# Patient Record
Sex: Female | Born: 1995 | Race: White | Hispanic: No | Marital: Single | Smoking: Current every day smoker
Health system: Southern US, Community
[De-identification: ages and names within clinical notes are randomized; demographics above are authoritative.]

## PROBLEM LIST (undated history)

## (undated) HISTORY — PX: ROTATOR CUFF REPAIR: SHX139

---

## 2016-10-02 ENCOUNTER — Encounter (HOSPITAL_COMMUNITY): Payer: Self-pay | Admitting: *Deleted

## 2016-10-02 ENCOUNTER — Emergency Department (HOSPITAL_COMMUNITY): Payer: BC Managed Care – PPO

## 2016-10-02 ENCOUNTER — Emergency Department (HOSPITAL_COMMUNITY)
Admission: EM | Admit: 2016-10-02 | Discharge: 2016-10-02 | Disposition: A | Discharge status: Home / Self Care | Payer: BC Managed Care – PPO | Attending: Emergency Medicine | Admitting: Emergency Medicine

## 2016-10-02 DIAGNOSIS — F17200 Nicotine dependence, unspecified, uncomplicated: Secondary | ICD-10-CM | POA: Insufficient documentation

## 2016-10-02 DIAGNOSIS — M25512 Pain in left shoulder: Secondary | ICD-10-CM | POA: Insufficient documentation

## 2016-10-02 DIAGNOSIS — Y939 Activity, unspecified: Secondary | ICD-10-CM | POA: Insufficient documentation

## 2016-10-02 DIAGNOSIS — Y998 Other external cause status: Secondary | ICD-10-CM | POA: Diagnosis not present

## 2016-10-02 DIAGNOSIS — W19XXXA Unspecified fall, initial encounter: Secondary | ICD-10-CM | POA: Insufficient documentation

## 2016-10-02 DIAGNOSIS — Y929 Unspecified place or not applicable: Secondary | ICD-10-CM | POA: Diagnosis not present

## 2016-10-02 LAB — POC URINE PREG, ED: PREG TEST UR: NEGATIVE

## 2016-10-02 MED ORDER — ACETAMINOPHEN 500 MG PO TABS
1000.0000 mg | ORAL_TABLET | Freq: Once | ORAL | Status: AC
  Administered 2016-10-02: 1000 mg via ORAL
  Filled 2016-10-02: qty 2

## 2016-10-02 MED ORDER — KETOROLAC TROMETHAMINE 60 MG/2ML IM SOLN
60.0000 mg | Freq: Once | INTRAMUSCULAR | Status: AC
  Administered 2016-10-02: 60 mg via INTRAMUSCULAR
  Filled 2016-10-02: qty 2

## 2016-10-02 NOTE — ED Provider Notes (Signed)
[facility] Provider Note   CSN: [id number] Arrival date & time: 10/02/16  1620     History   Chief Complaint Chief Complaint  Patient presents with  . Shoulder Injury    HPI Sheryl Holland is a 21 y.o. female.  Left shoulder pain after a hyperextension injury secondary to a fall just prio to ED visit. She is a patient at [facility] for alcohol and other drugs. She is status post left shoulder [place] a [place]ator cuff [place] and other issues.  Severity of pain is moderate. Movement and palpation make pain worse.      History reviewed. No pertinent past medical history.  There are no active problems to display for this patient.   History reviewed. No pertinent surgical history.  OB History    No data available       Home Medications    Prior to Admission medications   Medication Sig Start Date End Date Taking? Authorizing Provider  ARIPiprazole (ABILIFY) 5 MG tablet Take 5 mg by mouth daily.   Yes [provider]  Calcium & Magnesium Carbonates (MYLANTA PO) Take 10-20 mLs by mouth daily as needed (indigestion).   Yes [provider]  FLUoxetine (PROZAC) 20 MG tablet Take 60 mg by mouth daily.   Yes [provider]  ketorolac (TORADOL) 10 MG tablet Take 10 mg by mouth See admin instructions. Ordered 10/01/16 - take 1 tablet (10 mg) by mouth three times daily for 3 days   Yes [provider]  Melatonin 5 MG TABS Take 5 mg by mouth at bedtime.   Yes [provider]  PRESCRIPTION MEDICATION Take 15-30 mLs by mouth See admin instructions. Nausea control liquid - take 1-2 tablespoons (15-30 mls) every 15 minutes as needed for nausea and vomiting until relief   Yes [provider]  traZODone (DESYREL) 50 MG tablet Take 50 mg by mouth at bedtime.   Yes [provider]    Family History No family history on file.  Social History Social History  Substance  Use Topics  . Smoking status: Current Every Day Smoker  . Smokeless tobacco: Never Used  . Alcohol use No     Allergies   Patient has no known allergies.   Review of Systems Review of Systems  All other systems reviewed and are negative.    Physical Exam Updated Vital Signs BP (!) 143/89 (BP Location: Right Arm)   Pulse 96   Temp 98.3 F (36.8 C) (Oral)   Resp 16   Ht 5\' 9"  (1.753 m)   Wt 77.1 kg (170 lb)   LMP 09/02/2016   SpO2 100%   BMI 25.10 kg/m   Physical Exam  Constitutional: She is oriented to person, place, and time. She appears well-developed and well-nourished.  HENT:  Head: Normocephalic and atraumatic.  Eyes: Conjunctivae are normal.  Neck: Neck supple.  Cardiovascular: Normal rate and regular rhythm.   Pulmonary/Chest: Effort normal and breath sounds normal.  Abdominal: Soft. Bowel sounds are normal.  Musculoskeletal:  Left shoulder: Moderate generalized tenderness about the shoulder;  pain with range of motion  Neurological: She is alert and oriented to person, place, and time.  Skin: Skin is warm and dry.  Psychiatric: She has a normal mood and affect. Her behavior is normal.  Nursing note and vitals reviewed.    ED Treatments / Results  Labs (all labs ordered are listed, but only abnormal results are displayed)  Labs Reviewed  POC URINE PREG, ED    EKG  EKG Interpretation None       Radiology Ct Shoulder Left Wo Contrast  Result Date: 10/02/2016 CLINICAL DATA:  Patient fell backwards onto her bed 2 hours ago. History of shoulder surgery 2 months ago. Question of anterior subluxation on same day radiographs of the left shoulder. EXAM: CT OF THE UPPER LEFT EXTREMITY WITHOUT CONTRAST TECHNIQUE: Multidetector CT imaging of the upper left extremity was performed according to the standard protocol. COMPARISON:  10/02/2016 shoulder radiographs FINDINGS: Bones/Joint/Cartilage No dislocation or subluxation of the humeral head at the  glenohumeral joint. Small glenohumeral joint effusion without intra-articular loose bodies. Postop appearance of the glenoid presumably for a bony Bankart lesion with two fixation screws noted through the glenoid. Surgical anchorsnoted within the humeral head. Large Hill-Sachs deformity noted of the humeral head posterolaterally. Ligaments Suboptimally assessed by CT. Muscles and Tendons Negative Soft tissues Negative.  The adjacent ribs and included lungs are nonacute. IMPRESSION: 1. Postop appearance of the left shoulder with joint effusion. No joint subluxation or dislocation is identified. 2. 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 Electronically Signed   By: Tollie Eth M.D.   On: 10/02/2016 21:03   Dg Shoulder Left  Result Date: 10/02/2016 CLINICAL DATA:  Generalized left shoulder pain after a fall today. EXAM: LEFT SHOULDER - 2+ VIEW COMPARISON:  None. FINDINGS: Cannulated screws are identified in the glenoid with surgical device overlying the proximal humerus. No evidence of an acute fracture. The acromioclavicular and coracoclavicular distances appear preserved. Joint space in the glenoid is narrowed and the scapular Y-view suggests anterior subluxation of the humeral head. IMPRESSION: Limited study due to patient discomfort and inability to appropriately position. Postsurgical changes are noted in the shoulder with loss of joint space in the inferior glenohumeral joint and question of anterior subluxation of the humeral head. Electronically Signed   By: Kennith Center M.D.   On: 10/02/2016 18:06    Procedures Procedures (including critical care time)  Medications Ordered in ED Medications  ketorolac (TORADOL) injection 60 mg (60 mg Intramuscular Given 10/02/16 1806)  acetaminophen (TYLENOL) tablet 1,000 mg (1,000 mg Oral Given 10/02/16 2038)     Initial Impression / Assessment and Plan / ED Course  I have reviewed the triage vital  signs and the nursing notes.  Pertinent labs & imaging results that were available during my care of the patient were reviewed by me and considered in my medical decision making (see chart for details).     Patient is hemodynamically stable. CT scan report as above. There appears to be no acute injury. Will recommend ice, sling, Tylenol, ibuprofen, referral to local orthopedics.  Final Clinical Impressions(s) / ED Diagnoses   Final diagnoses:  Acute pain of left shoulder    New Prescriptions New Prescriptions   No medications on file     Donnetta Hutching, MD 10/02/16 2130

## 2016-10-02 NOTE — ED Triage Notes (Signed)
The pt reports that she fell backward  Onto her bed  approx  2 hours ago .  [place]  [facility]nel [facility] from [facility]

## 2016-10-02 NOTE — ED Notes (Signed)
Zenett from [facility] called for update.  Advised we are awaiting CT results.  Asked that she ne called upon patients ready for discharge at [phone].  No narcotics.

## 2016-10-02 NOTE — ED Notes (Signed)
Received call from someone named Mrs Sheryl Holland from [facility] reporting that pt is part of program at [facility] and is not allowed to have any narcotics for pain. Per Mrs Sheryl Holland, pt has old shoulder injury that is healed.

## 2016-10-02 NOTE — ED Notes (Signed)
[facility] states patient recently faked injury to seek narcotics from ED. Story about injury has changed a few times. Pt. 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

## 2016-10-02 NOTE — Discharge Instructions (Signed)
Your CT scan of the shoulder shows no serious damage. Recommend ice, sling, Tylenol, ibuprofen. Can take Tylenol and ibuprofen simultaneously.

## 2016-10-11 ENCOUNTER — Emergency Department (HOSPITAL_COMMUNITY)
Admission: EM | Admit: 2016-10-11 | Discharge: 2016-10-11 | Disposition: A | Discharge status: Home / Self Care | Payer: BC Managed Care – PPO | Attending: Emergency Medicine | Admitting: Emergency Medicine

## 2016-10-11 ENCOUNTER — Encounter (HOSPITAL_COMMUNITY): Payer: Self-pay | Admitting: *Deleted

## 2016-10-11 ENCOUNTER — Emergency Department (HOSPITAL_COMMUNITY): Payer: BC Managed Care – PPO

## 2016-10-11 DIAGNOSIS — F17200 Nicotine dependence, unspecified, uncomplicated: Secondary | ICD-10-CM | POA: Diagnosis not present

## 2016-10-11 DIAGNOSIS — M25512 Pain in left shoulder: Secondary | ICD-10-CM | POA: Insufficient documentation

## 2016-10-11 DIAGNOSIS — Z79899 Other long term (current) drug therapy: Secondary | ICD-10-CM | POA: Insufficient documentation

## 2016-10-11 MED ORDER — KETOROLAC TROMETHAMINE 30 MG/ML IJ SOLN
30.0000 mg | Freq: Once | INTRAMUSCULAR | Status: AC
  Administered 2016-10-11: 30 mg via INTRAVENOUS
  Filled 2016-10-11: qty 1

## 2016-10-11 MED ORDER — NAPROXEN 500 MG PO TABS: 500.0000 mg | ORAL_TABLET | Freq: Two times a day (BID) | ORAL | 0 refills | Status: DC

## 2016-10-11 MED ORDER — METHOCARBAMOL 500 MG PO TABS: 500.0000 mg | ORAL_TABLET | Freq: Four times a day (QID) | ORAL | 0 refills | Status: DC

## 2016-10-11 MED ORDER — DEXTROSE 5 % IV SOLN
1000.0000 mg | Freq: Once | INTRAVENOUS | Status: AC
  Administered 2016-10-11: 1000 mg via INTRAVENOUS
  Filled 2016-10-11 (×2): qty 10

## 2016-10-11 MED ORDER — METHOCARBAMOL 1000 MG/10ML IJ SOLN: 1000.0000 mg | Freq: Once | INTRAMUSCULAR | Status: DC

## 2016-10-11 NOTE — Progress Notes (Signed)
Orthopedic Tech Progress Note Patient Details:  Sheryl Holland 1995/04/27 [id number]  Ortho Devices Type of Ortho Device: Sling immobilizer Ortho Device/Splint Location: lue Ortho Device/Splint Interventions: Application   Sheryl Holland 10/11/2016, 12:36 PM

## 2016-10-11 NOTE — ED Provider Notes (Signed)
[facility] Provider Note   CSN: [id number] Arrival date & time: 10/11/16  1014     History   Chief Complaint Chief Complaint  Patient presents with  . Shoulder Pain    dislocation    HPI Sheryl Holland is a 21 y.o. female.  Patient with history of substance abuse, currently at [facility], patient with multiple left shoulder dislocations with surgery performed 2 months ago -- presents with complaint of probable shoulder dislocation. Patient with acute onset of left shoulder pain. Patient states that she had her arm around somebody and felt it slip out of joint. She had immediate pain. No treatments prior to arrival. Symptoms are similar to previous dislocations. The onset of this condition was acute. The course is constant. Aggravating factors: movement. Alleviating factors: none.        History reviewed. No pertinent past medical history.  There are no active problems to display for this patient.   Past Surgical History:  Procedure Laterality Date  . ROTATOR CUFF REPAIR      OB History    No data available       Home Medications    Prior to Admission medications   Medication Sig Start Date End Date Taking? Authorizing Provider  ARIPiprazole (ABILIFY) 5 MG tablet Take 5 mg by mouth daily.    [provider]  Calcium & Magnesium Carbonates (MYLANTA PO) Take 10-20 mLs by mouth daily as needed (indigestion).    [provider]  FLUoxetine (PROZAC) 20 MG tablet Take 60 mg by mouth daily.    [provider]  ketorolac (TORADOL) 10 MG tablet Take 10 mg by mouth See admin instructions. Ordered 10/01/16 - take 1 tablet (10 mg) by mouth three times daily for 3 days    [provider]  Melatonin 5 MG TABS Take 5 mg by mouth at bedtime.    [provider]  PRESCRIPTION MEDICATION Take 15-30 mLs by mouth See admin instructions. Nausea control liquid - take 1-2 tablespoons (15-30 mls) every 15 minutes as needed for nausea  and vomiting until relief    [provider]  traZODone (DESYREL) 50 MG tablet Take 50 mg by mouth at bedtime.    [provider]    Family History No family history on file.  Social History Social History  Substance Use Topics  . Smoking status: Current Every Day Smoker  . Smokeless tobacco: Never Used  . Alcohol use No     Allergies   Patient has no known allergies.   Review of Systems Review of Systems  Constitutional: Negative for activity change.  Musculoskeletal: Positive for arthralgias. Negative for back pain, joint swelling and neck pain.  Skin: Negative for wound.  Neurological: Negative for weakness and numbness.     Physical Exam Updated Vital Signs BP 131/81   Pulse 95   Temp 98.8 F (37.1 C) (Oral)   Resp (!) 22   Ht 5\' 9"  (1.753 m)   Wt 72.6 kg (160 lb)   LMP 10/06/2016   SpO2 100%   BMI 23.63 kg/m   Physical Exam  Constitutional: She appears well-developed and well-nourished.  HENT:  Head: Normocephalic and atraumatic.  Eyes: Pupils are equal, round, and reactive to light.  Neck: Normal range of motion. Neck supple.  Cardiovascular: Normal pulses.  Exam reveals no decreased pulses.   Musculoskeletal: She exhibits tenderness. She exhibits no edema.       Right shoulder: Normal.       Left  shoulder: She exhibits decreased range of motion and tenderness. She exhibits no swelling.       Right elbow: Normal.      Left elbow: Normal.       Cervical back: Normal.  Neurological: She is alert. No sensory deficit.  Motor, sensation, and vascular distal to the injury is fully intact.   Skin: Skin is warm and dry.  Psychiatric: She has a normal mood and affect.  Nursing note and vitals reviewed.    ED Treatments / Results   Radiology Dg Shoulder Left  Result Date: 10/11/2016 CLINICAL DATA:  Fall. EXAM: LEFT SHOULDER - 2+ VIEW COMPARISON:  None. FINDINGS: Surgical screw noted about the left acetabulum in unchanged position.  Metallic density noted in the left humerus in unchanged position. No acute abnormality identified. No evidence of fracture or dislocation . IMPRESSION: Stable postsurgical changes left shoulder. No acute abnormality identified. Electronically Signed   By: Maisie Fushomas  Register   On: 10/11/2016 11:12    Procedures Procedures (including critical care time)  Medications Ordered in ED Medications  methocarbamol (ROBAXIN) injection 1,000 mg (not administered)     Initial Impression / Assessment and Plan / ED Course  I have reviewed the triage vital signs and the nursing notes.  Pertinent labs & imaging results that were available during my care of the patient were reviewed by me and considered in my medical decision making (see chart for details).     Vital signs reviewed and are as follows: Vitals:   10/11/16 1230 10/11/16 1245  BP: 96/69 107/63  Pulse: 75 78  Resp:    Temp:     X-ray and previous CT reviewed. No signs of dislocation today.  Patient treated in the emergency department with Toradol and IV Robaxin.  She states that she is able to have muscle relaxers at her treatment facility. Will give course of Robaxin. Patient to use sling as needed for symptoms control while shoulder is acutely tender.   Final Clinical Impressions(s) / ED Diagnoses   Final diagnoses:  Acute pain of left shoulder   Patient with acute left shoulder pain, history of previous surgery and frequent dislocations. Patient had emergency department visit with x-ray and CT one week ago which did not demonstrate dislocation. X-ray similar appearing today without dislocation. Will treat conservatively. Pt currently in treatment for substance abuse.   New Prescriptions New Prescriptions   METHOCARBAMOL (ROBAXIN) 500 MG TABLET    Take 1 tablet (500 mg total) by mouth 4 (four) times daily.   NAPROXEN (NAPROSYN) 500 MG TABLET    Take 1 tablet (500 mg total) by mouth 2 (two) times daily.     Renne CriglerGeiple, Demarqus Jocson,  PA-C 10/11/16 1253    Charlynne PanderYao, David Hsienta, MD 10/13/16 [phone]

## 2016-10-11 NOTE — ED Triage Notes (Signed)
[facility] here from [facility] for possible L shoulder dislocation.  States was propping arm up on chair and shoulder dislocated.  States unable to take narcotics d/t living in [facility].  Unable to take tylenol d/t Hep C and unable to use propofol d/t tolerance r/t multiple sedations.

## 2016-10-11 NOTE — ED Notes (Signed)
Pt states she is ready to go home 

## 2016-10-11 NOTE — Discharge Instructions (Signed)
Please read and follow all provided instructions.  Your diagnoses today include:  1. Acute pain of left shoulder     Tests performed today include:  An x-ray of the affected area - does NOT show any broken bones or dislocation  Vital signs. See below for your results today.   Medications prescribed:   Robaxin (methocarbamol) - muscle relaxer medication  DO NOT drive or perform any activities that require you to be awake and alert because this medicine can make you drowsy.    Naproxen - anti-inflammatory pain medication  Do not exceed 500mg  naproxen every 12 hours, take with food  You have been prescribed an anti-inflammatory medication or NSAID. Take with food. Take smallest effective dose for the shortest duration needed for your pain. Stop taking if you experience stomach pain or vomiting.   Take any prescribed medications only as directed.  Home care instructions:   Follow any educational materials contained in this packet  Follow R.I.C.E. Protocol:  R - rest your injury   I  - use ice on injury without applying directly to skin  C - compress injury with bandage or splint  E - elevate the injury as much as possible  Follow-up instructions: Please follow-up with your orthopedic physician (bone specialist) if you continue to have significant pain in 1 week.   Return instructions:   Please return if your fingers are numb or tingling, appear gray or blue, or you have severe pain (also elevate the arm and loosen splint or wrap if you were given one)  Please return to the Emergency Department if you experience worsening symptoms.   Please return if you have any other emergent concerns.  Additional Information:  Your vital signs today were: BP 107/63    Pulse 78    Temp 98.8 F (37.1 C) (Oral)    Resp (!) 22    Ht 5\' 9"  (1.753 m)    Wt 72.6 kg (160 lb)    LMP 10/06/2016    SpO2 98%    BMI 23.63 kg/m  If your blood pressure (BP) was elevated above 135/85 this  visit, please have this repeated by your doctor within one month. --------------

## 2016-10-16 ENCOUNTER — Emergency Department (HOSPITAL_COMMUNITY): Payer: BC Managed Care – PPO

## 2016-10-16 ENCOUNTER — Encounter (HOSPITAL_COMMUNITY): Payer: Self-pay

## 2016-10-16 ENCOUNTER — Emergency Department (HOSPITAL_COMMUNITY)
Admission: EM | Admit: 2016-10-16 | Discharge: 2016-10-16 | Disposition: A | Discharge status: Home / Self Care | Payer: BC Managed Care – PPO | Attending: Emergency Medicine | Admitting: Emergency Medicine

## 2016-10-16 DIAGNOSIS — F17200 Nicotine dependence, unspecified, uncomplicated: Secondary | ICD-10-CM | POA: Diagnosis not present

## 2016-10-16 DIAGNOSIS — Y93F9 Activity, other caregiving: Secondary | ICD-10-CM | POA: Insufficient documentation

## 2016-10-16 DIAGNOSIS — Y33XXXA Other specified events, undetermined intent, initial encounter: Secondary | ICD-10-CM | POA: Insufficient documentation

## 2016-10-16 DIAGNOSIS — Y929 Unspecified place or not applicable: Secondary | ICD-10-CM | POA: Diagnosis not present

## 2016-10-16 DIAGNOSIS — Z79899 Other long term (current) drug therapy: Secondary | ICD-10-CM | POA: Diagnosis not present

## 2016-10-16 DIAGNOSIS — S43012A Anterior subluxation of left humerus, initial encounter: Secondary | ICD-10-CM | POA: Diagnosis not present

## 2016-10-16 DIAGNOSIS — Y998 Other external cause status: Secondary | ICD-10-CM | POA: Insufficient documentation

## 2016-10-16 DIAGNOSIS — M25512 Pain in left shoulder: Secondary | ICD-10-CM | POA: Diagnosis present

## 2016-10-16 MED ORDER — METHOCARBAMOL 500 MG PO TABS: 500.0000 mg | ORAL_TABLET | Freq: Four times a day (QID) | ORAL | 0 refills | Status: AC

## 2016-10-16 MED ORDER — FENTANYL CITRATE (PF) 100 MCG/2ML IJ SOLN
50.0000 ug | Freq: Once | INTRAMUSCULAR | Status: AC
  Administered 2016-10-16: 50 ug via INTRAVENOUS
  Filled 2016-10-16: qty 2

## 2016-10-16 MED ORDER — KETOROLAC TROMETHAMINE 30 MG/ML IJ SOLN
30.0000 mg | Freq: Once | INTRAMUSCULAR | Status: AC
  Administered 2016-10-16: 30 mg via INTRAVENOUS
  Filled 2016-10-16: qty 1

## 2016-10-16 MED ORDER — NAPROXEN 500 MG PO TABS: 500.0000 mg | ORAL_TABLET | Freq: Two times a day (BID) | ORAL | 0 refills | Status: AC

## 2016-10-16 NOTE — Discharge Instructions (Signed)
Please follow up closely with your orthopedist for further management of your shoulder pain.  You will likely benefit from physical therapy.

## 2016-10-16 NOTE — ED Triage Notes (Signed)
Pt presents with deformity to L shoulder after reaching back to pull hair back.  Pt had surgery x 2.5 months ago.

## 2016-10-16 NOTE — ED Notes (Signed)
Patient transported to X-ray 

## 2016-10-16 NOTE — ED Provider Notes (Signed)
[facility] Provider Note   CSN: [id number] Arrival date & time: 10/16/16  1120     History   Chief Complaint No chief complaint on file.   HPI Sheryl Holland is a 21 y.o. female.  HPI   21 year old female with history of substance abuse currently at [facility], history of recurrent left shoulder dislocation status post surgery performed 2 months ago presenting complaining of Left shoulder pain. Patient states prior to arrival, she was just reaching her left arm up to brush her hair when she experienced an acute onset of sharp shooting pain to her left shoulder with tingling sensation down her left third and fourth finger. She was unable to move her left shoulder afterward and belief that she may have dislocated her shoulder again. Pain is intense, worse with movement. No neck pain or chest pain. No specific treatment tried. Patient mention having recent shoulder surgery 2 and half months ago [place] [facility] [place]. 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  No past medical history on file.  There are no active problems to display for this patient.   Past Surgical History:  Procedure Laterality Date  . ROTATOR CUFF REPAIR      OB History    No data available       Home Medications    Prior to Admission medications   Medication Sig Start Date End Date Taking? Authorizing Provider  ARIPiprazole (ABILIFY) 5 MG tablet Take 5 mg by mouth daily.    [provider]  FLUoxetine (PROZAC) 20 MG tablet Take 60 mg by mouth daily.    [provider]  methocarbamol (ROBAXIN) 500 MG tablet Take 1 tablet (500 mg total) by mouth 4 (four) times daily. 10/11/16   Renne CriglerGeiple, Joshua, PA-C  Multiple Vitamin (MULTIVITAMIN WITH MINERALS) TABS tablet Take 1 tablet by mouth daily.    [provider]  naproxen (NAPROSYN) 500 MG tablet Take 1 tablet (500 mg total) by mouth 2 (two) times daily. 10/11/16   Renne CriglerGeiple, Joshua, PA-C    Family  History No family history on file.  Social History Social History  Substance Use Topics  . Smoking status: Current Every Day Smoker  . Smokeless tobacco: Never Used  . Alcohol use No     Allergies   Patient has no known allergies.   Review of Systems Review of Systems  Constitutional: Negative for fever.  Musculoskeletal: Positive for arthralgias. Negative for neck pain.  Neurological: Positive for numbness.     Physical Exam Updated Vital Signs LMP 10/06/2016   Physical Exam  Constitutional: She appears well-developed and well-nourished. She appears distressed (Patient appears uncomfortable, crying).  HENT:  Head: Atraumatic.  Eyes: Conjunctivae are normal.  Neck: Normal range of motion. Neck supple.  No cervical midline tenderness  Musculoskeletal: She exhibits tenderness (Left shoulder: Exquisite tenderness noted throughout left shoulder on palpation with decreased range of motion, difficult to assess for deformity. Sensation is intact throughout distal arm including fingers. Radial pulse 2+).  Neurological: She is alert.  Skin: No rash noted.  Psychiatric: She has a normal mood and affect.  Nursing note and vitals reviewed.    ED Treatments / Results  Labs (all labs ordered are listed, but only abnormal results are displayed) Labs Reviewed - No data to display  EKG  EKG Interpretation None       Radiology Dg Shoulder Left  Result Date: 10/16/2016 CLINICAL DATA:  21 year old female with left shoulder pain after she  reached backwards to pull her hair back. She recently had surgery. EXAM: LEFT SHOULDER - 2+ VIEW COMPARISON:  Preoperative radiographs 10/11/2016 FINDINGS: Positive for anterior subluxation of the humeral head with respect to the glenoid. Surgical changes are again evident with 2 lag screws through the glenoid and a soft tissue anchor in the region of the biceps tendon. No evidence of acute fracture or malalignment. IMPRESSION: Positive for  anterior subluxation of the humeral head with respect to the glenoid. Electronically Signed   By: Malachy MoanHeath  McCullough M.D.   On: 10/16/2016 12:29    Procedures Procedures (including critical care time)  Medications Ordered in ED Medications  ketorolac (TORADOL) 30 MG/ML injection 30 mg (30 mg Intravenous Given 10/16/16 1158)  fentaNYL (SUBLIMAZE) injection 50 mcg (50 mcg Intravenous Given 10/16/16 1245)     Initial Impression / Assessment and Plan / ED Course  I have reviewed the triage vital signs and the nursing notes.  Pertinent labs & imaging results that were available during my care of the patient were reviewed by me and considered in my medical decision making (see chart for details).     BP 122/86 (BP Location: Right Arm)   Pulse 76   Temp 98.4 F (36.9 C) (Oral)   Resp (!) 24   LMP 10/12/2016 (Approximate)   SpO2 98%    Final Clinical Impressions(s) / ED Diagnoses   Final diagnoses:  Anterior subluxation of left shoulder, initial encounter    New Prescriptions Current Discharge Medication List     11:59 AM Patient with history of recurrent left shoulder dislocation, recent shoulder surgery here with pain and decreased range of motion to her left shoulder suggestive of left shoulder dislocation. She was seen in the ER less than a week ago for the same complaint. X-ray at that time without evidence of dislocation. She is requesting pain medication, I will provide nonsteroidal anti-inflammatory medication via IV. Will obtain x-ray.  1:01 PM Xray demonstrates anterior subluxation of the humeral head.  Will placed shoulder in sling.  Pt d/c with NSAIDs and Muscle relaxant.  She will need to f/u with her orthopedist for further care which likely include physical therapy. She is NVI on my exam.    Fayrene Helperran, Pegeen Stiger, PA-C 10/16/16 1302    Gerhard MunchLockwood, Robert, MD 10/16/16 [phone]

## 2019-03-23 IMAGING — DX DG SHOULDER 2+V*L*
4 series · 4 of 4 positions shown · non-contrast
Comparison: Preoperative radiographs 10/11/2016

CLINICAL DATA: 20-year-old female with left shoulder pain after she
reached backwards to pull her hair back. She recently had surgery.

EXAM:
LEFT SHOULDER - 2+ VIEW

[x shoulder axillary left]
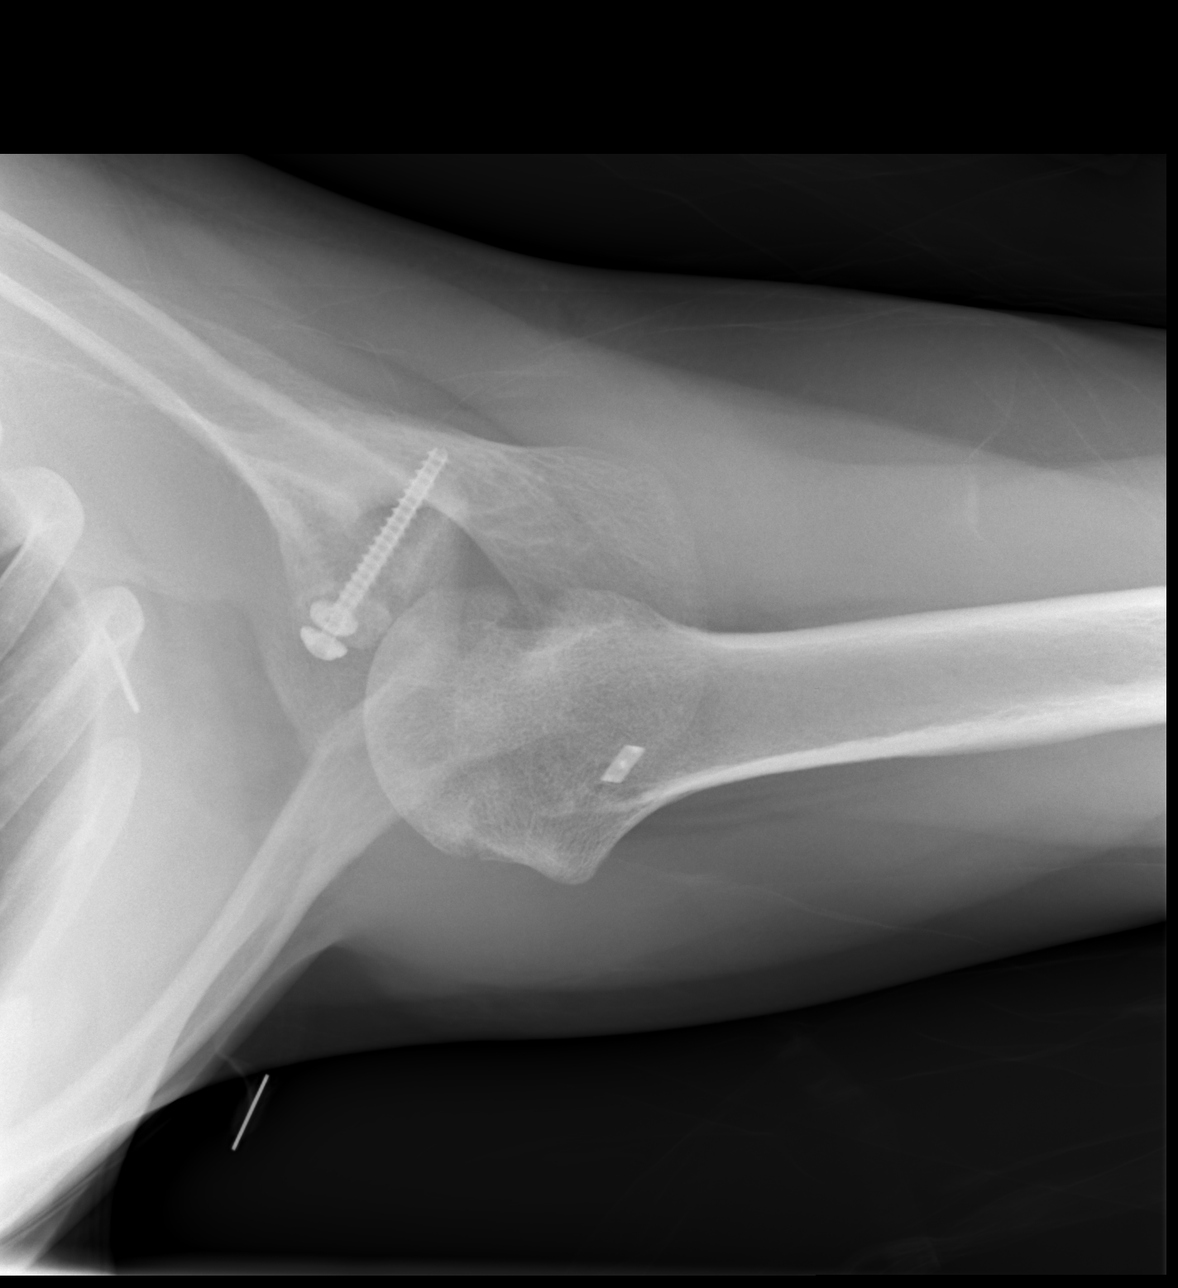

[w shoulder internal left]
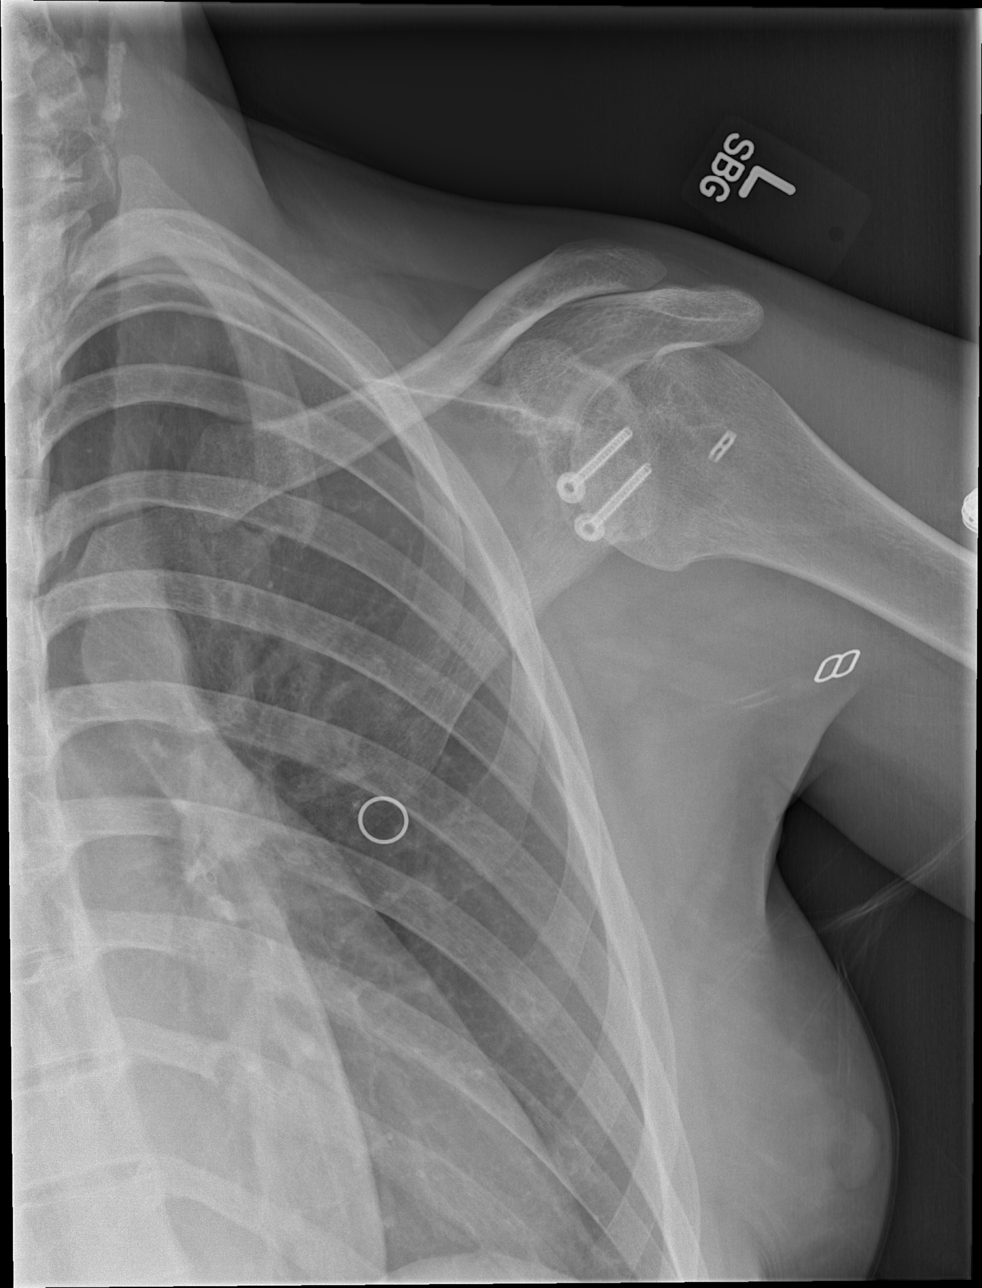

[w shoulder y-view left (1 of 2)]
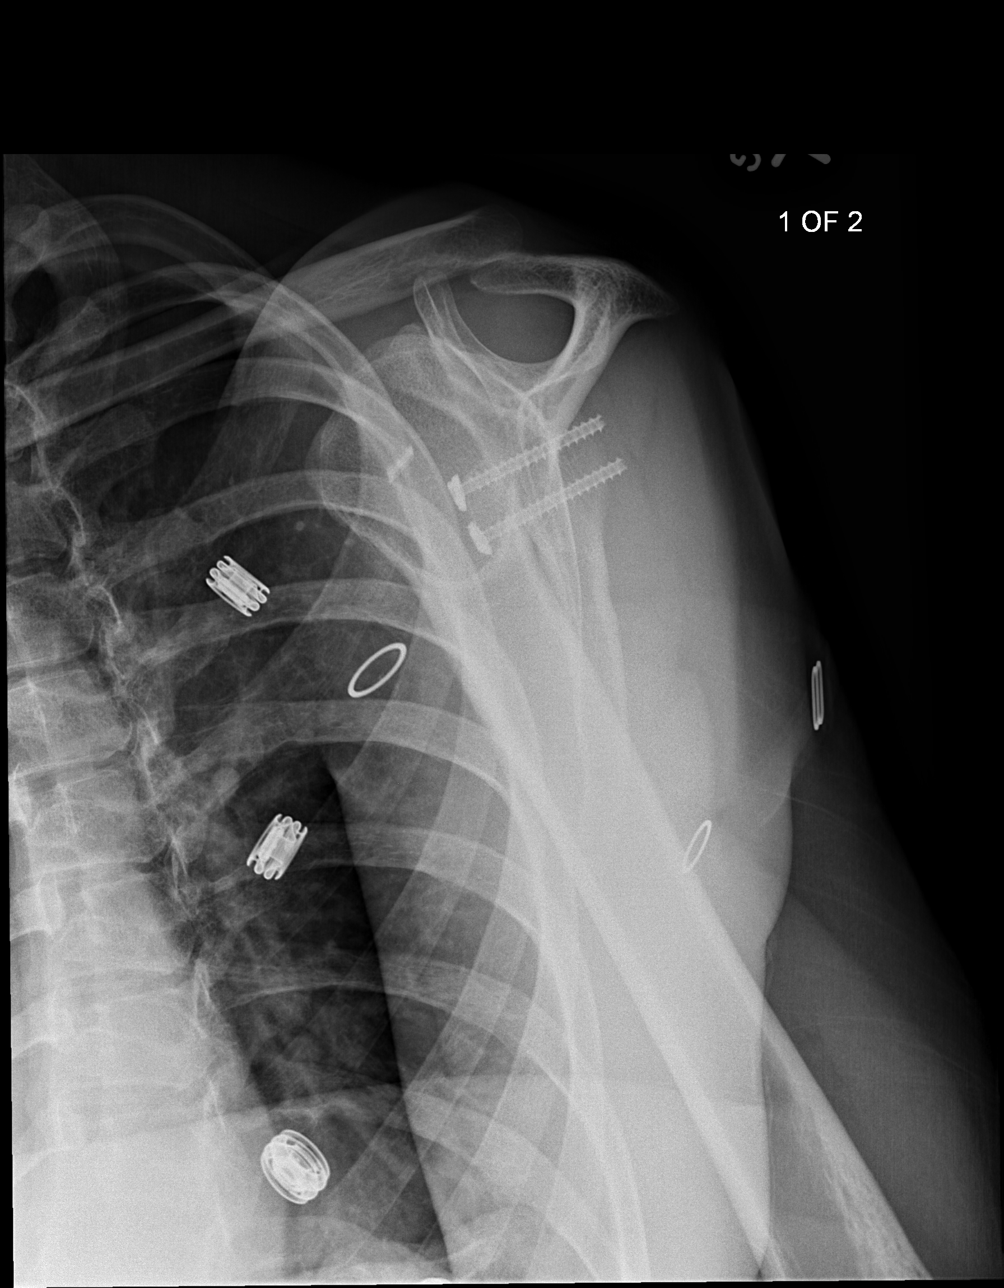

[w shoulder y-view left (2 of 2)]
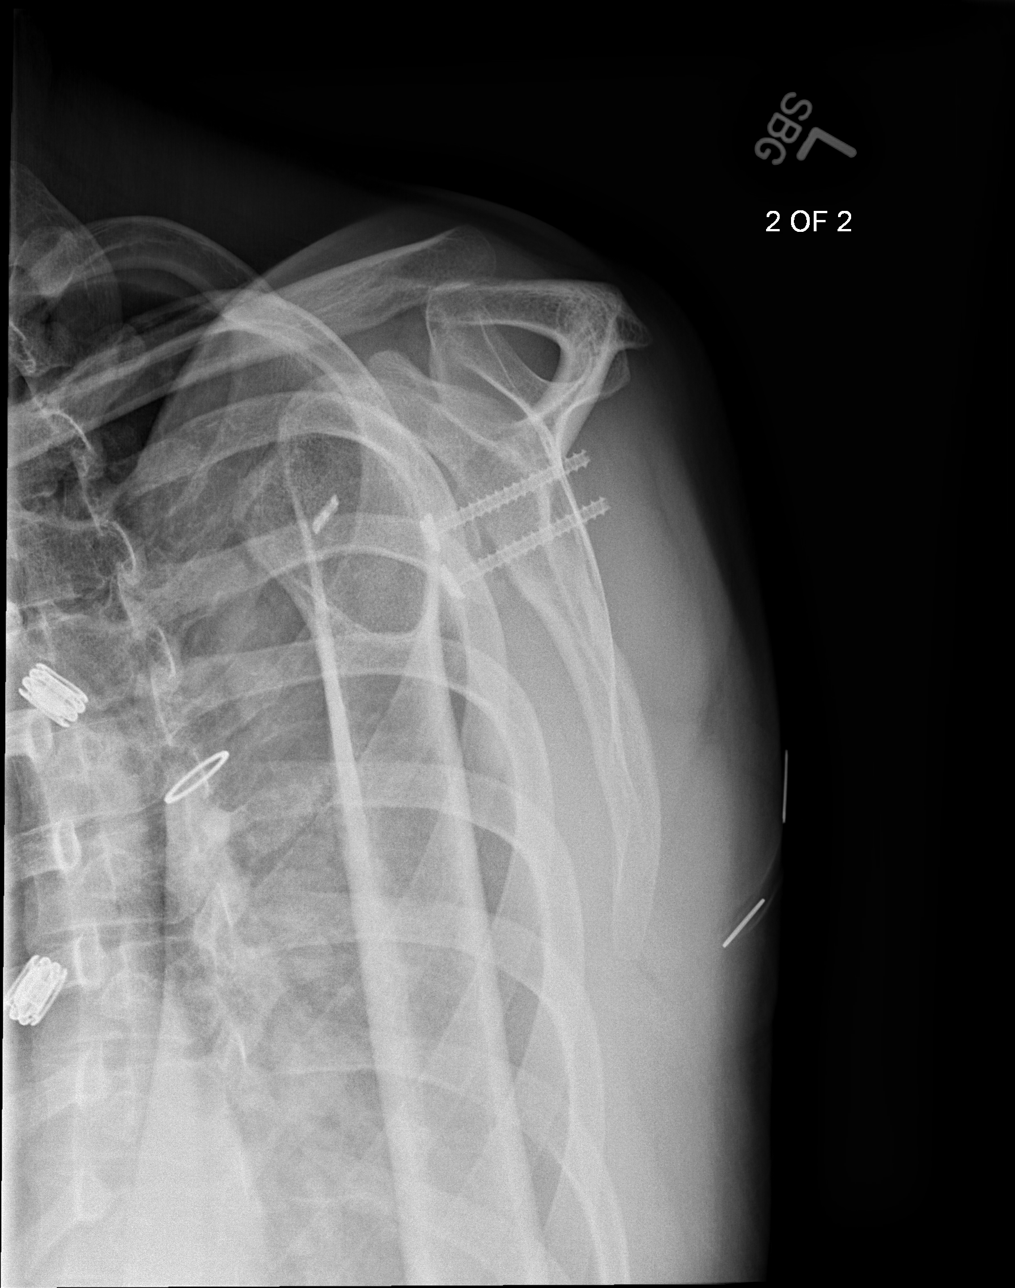

[4 of 4 positions shown; findings below may reference images not displayed]

FINDINGS: Positive for anterior subluxation of the humeral head with respect
to the glenoid. Surgical changes are again evident with 2 lag screws
through the glenoid and a soft tissue anchor in the region of the
biceps tendon. No evidence of acute fracture or malalignment.
IMPRESSION: Positive for anterior subluxation of the humeral head with respect
to the glenoid.
